# Patient Record
Sex: Female | Born: 1985 | Race: Black or African American | Hispanic: No | Marital: Single | Smoking: Never smoker
Health system: Southern US, Community
[De-identification: ages and names within clinical notes are randomized; demographics above are authoritative.]

## PROBLEM LIST (undated history)

## (undated) DIAGNOSIS — Z789 Other specified health status: Secondary | ICD-10-CM

---

## 2009-09-09 ENCOUNTER — Inpatient Hospital Stay (HOSPITAL_COMMUNITY): Admission: AD | Admit: 2009-09-09 | Discharge: 2009-09-11 | Payer: Self-pay | Admitting: Obstetrics & Gynecology

## 2010-04-07 ENCOUNTER — Ambulatory Visit (HOSPITAL_COMMUNITY)
Admission: RE | Admit: 2010-04-07 | Discharge: 2010-04-07 | Payer: Self-pay | Source: Home / Self Care | Admitting: Obstetrics and Gynecology

## 2010-06-21 ENCOUNTER — Inpatient Hospital Stay (HOSPITAL_COMMUNITY)
Admission: AD | Admit: 2010-06-21 | Discharge: 2010-06-21 | Disposition: A | Discharge status: Home / Self Care | Payer: Private Health Insurance - Indemnity | Source: Ambulatory Visit | Attending: Obstetrics and Gynecology | Admitting: Obstetrics and Gynecology

## 2010-06-21 DIAGNOSIS — O4700 False labor before 37 completed weeks of gestation, unspecified trimester: Secondary | ICD-10-CM | POA: Insufficient documentation

## 2010-06-21 DIAGNOSIS — R109 Unspecified abdominal pain: Secondary | ICD-10-CM | POA: Insufficient documentation

## 2010-06-21 LAB — COMPREHENSIVE METABOLIC PANEL
ALT: 14 U/L (ref 0–35)
AST: 21 U/L (ref 0–37)
Alkaline Phosphatase: 42 U/L (ref 39–117)
CO2: 24 mEq/L (ref 19–32)
Chloride: 102 mEq/L (ref 96–112)
Total Bilirubin: 0.5 mg/dL (ref 0.3–1.2)
Total Protein: 6.1 g/dL (ref 6.0–8.3)

## 2010-06-21 LAB — CBC
HCT: 33.7 % — ABNORMAL LOW (ref 36.0–46.0)
Hemoglobin: 11.2 g/dL — ABNORMAL LOW (ref 12.0–15.0)
RBC: 4.24 MIL/uL (ref 3.87–5.11)
RDW: 14 % (ref 11.5–15.5)
WBC: 9.5 10*3/uL (ref 4.0–10.5)

## 2010-06-21 LAB — FETAL FIBRONECTIN: Fetal Fibronectin: NEGATIVE

## 2010-06-21 LAB — URINALYSIS, ROUTINE W REFLEX MICROSCOPIC
Hgb urine dipstick: NEGATIVE
Ketones, ur: NEGATIVE mg/dL
Protein, ur: NEGATIVE mg/dL
Urine Glucose, Fasting: 100 mg/dL — AB
Urobilinogen, UA: 0.2 mg/dL (ref 0.0–1.0)

## 2010-06-21 LAB — WET PREP, GENITAL

## 2010-06-21 LAB — URINE MICROSCOPIC-ADD ON

## 2010-07-09 ENCOUNTER — Inpatient Hospital Stay (HOSPITAL_COMMUNITY)
Admission: AD | Admit: 2010-07-09 | Discharge: 2010-07-15 | DRG: 782 | Disposition: A | Discharge status: Home / Self Care | Payer: Managed Care, Other (non HMO) | Source: Ambulatory Visit | Attending: Obstetrics and Gynecology | Admitting: Obstetrics and Gynecology

## 2010-07-09 DIAGNOSIS — O4700 False labor before 37 completed weeks of gestation, unspecified trimester: Secondary | ICD-10-CM | POA: Diagnosis present

## 2010-07-09 DIAGNOSIS — O3430 Maternal care for cervical incompetence, unspecified trimester: Principal | ICD-10-CM | POA: Diagnosis present

## 2010-07-09 DIAGNOSIS — O26879 Cervical shortening, unspecified trimester: Secondary | ICD-10-CM | POA: Diagnosis present

## 2010-07-10 ENCOUNTER — Inpatient Hospital Stay (HOSPITAL_COMMUNITY): Payer: Managed Care, Other (non HMO)

## 2010-07-11 ENCOUNTER — Inpatient Hospital Stay (HOSPITAL_COMMUNITY): Payer: Managed Care, Other (non HMO)

## 2010-07-13 ENCOUNTER — Inpatient Hospital Stay (HOSPITAL_COMMUNITY): Payer: Managed Care, Other (non HMO)

## 2010-07-21 LAB — CBC
HCT: 37.4 % (ref 36.0–46.0)
MCHC: 33 g/dL (ref 30.0–36.0)
MCV: 83.5 fL (ref 78.0–100.0)
Platelets: 277 10*3/uL (ref 150–400)
RBC: 4.49 MIL/uL (ref 3.87–5.11)

## 2010-07-28 LAB — STREP B DNA PROBE: Strep Group B Ag: NEGATIVE

## 2010-07-28 LAB — CBC
HCT: 33.5 % — ABNORMAL LOW (ref 36.0–46.0)
MCV: 83.2 fL (ref 78.0–100.0)
WBC: 12 10*3/uL — ABNORMAL HIGH (ref 4.0–10.5)

## 2010-07-28 LAB — DIFFERENTIAL
Basophils Absolute: 0 10*3/uL (ref 0.0–0.1)
Basophils Relative: 0 % (ref 0–1)
Eosinophils Absolute: 0 10*3/uL (ref 0.0–0.7)
Eosinophils Relative: 0 % (ref 0–5)
Neutro Abs: 10.1 10*3/uL — ABNORMAL HIGH (ref 1.7–7.7)
Neutrophils Relative %: 84 % — ABNORMAL HIGH (ref 43–77)

## 2010-09-15 NOTE — Discharge Summary (Signed)
  NAMEJUSTISS, GERBINO            ACCOUNT NO.:  [id number]  MEDICAL RECORD NO.:  [id number]           PATIENT TYPE:  I  LOCATION:  9156                          FACILITY:  [facility]  PHYSICIAN:  Dineen Kid. Rana Snare, M.D.    DATE OF BIRTH:  Jun 09, 1985  DATE OF ADMISSION:  07/09/2010 DATE OF DISCHARGE:  07/15/2010                              DISCHARGE SUMMARY   ADMITTING DIAGNOSES: 1. Intrauterine pregnancy at 87 weeks' estimated gestational age. 2. History of cervical insufficiency and cervical cerclage with     shortening of the cervix and uterine contractions.  DISCHARGE DIAGNOSES: 1. Intrauterine pregnancy at 28-4/7 weeks' estimated gestational age. 2. Cervical insufficiency, tocolyzed.  REASON FOR ADMISSION:  Please see written H and P.  HOSPITAL COURSE:  The patient is a 25 year old, gravida 3, para 0-2-0-0 who was admitted at 50 weeks' estimated gestational age for further cervical shortening and uterine contractions.  The patient's prenatal care has been complicated by history of cervical insufficiency and previously placed cervical cerclage.  The patient was unable to feel the contraction.  She denied any vaginal bleeding or loss of fluid.  Fetal movement was good.  The patient's prenatal history is also complicated by a 24-week cesarean delivery for breech with preterm labor.  Baby had lived approximately 24 hours and also a previous 18-week spontaneous vaginal delivery of twins both of which were deceased.  On admission, cervical length was measured approximately 1.7 cm which was decreased from approximately 1 week ago at which time the cervix was noted to be 2.3 cm.  The patient was then admitted on bedrest.  Betamethasone was given and the patient was placed on magnesium sulfate.  Over the next several days, vital signs remained stable.  Uterus was nontender.  Fetal heart tones were reactive.  Ultrasound did reveal that cervix continued to be shortened.  She remained on  magnesium sulfate.  Fetal heart tones were auscultated with accelerations.  No contractions were observed.  On July 13, 2010, cervix was then measured approximately 1.6 cm.  Magnesium was being tapered.  The patient was restarted on Procardia.  On July 15, 2010, the patient was without complaint.  Fetal heart tones were 140 with accelerations.  Contractions were not observed.  The patient had now been completed betamethasone and was stable on the Procardia. Discharge instructions were reviewed and the patient was later discharged home.  CONDITION ON DISCHARGE:  Stable.  DIET:  Regular as tolerated.  ACTIVITY:  Bedrest with bathroom privileges.  DISCHARGE MEDICATIONS:  Procardia 10 mg every 6 hours and prenatal vitamins 1 p.o. daily.     Julio Sicks, N.P.   ______________________________ Dineen Kid Rana Snare, M.D.    CC/MEDQ  D:  08/10/2010  T:  08/11/2010  Job:  [id number]  Electronically Signed by Julio Sicks N.P. on 08/12/2010 08:54:12 AM Electronically Signed by Candice Camp M.D. on 09/15/2010 09:05:08 AM

## 2010-09-21 ENCOUNTER — Inpatient Hospital Stay (HOSPITAL_COMMUNITY)
Admission: AD | Admit: 2010-09-21 | Discharge: 2010-09-23 | DRG: 775 | Disposition: A | Discharge status: Home / Self Care | Payer: Managed Care, Other (non HMO) | Source: Ambulatory Visit | Attending: Obstetrics and Gynecology | Admitting: Obstetrics and Gynecology

## 2010-09-21 DIAGNOSIS — O34219 Maternal care for unspecified type scar from previous cesarean delivery: Principal | ICD-10-CM | POA: Diagnosis present

## 2010-09-21 LAB — CBC
Hemoglobin: 12.5 g/dL (ref 12.0–15.0)
MCHC: 33.6 g/dL (ref 30.0–36.0)
WBC: 8.7 10*3/uL (ref 4.0–10.5)

## 2010-09-21 LAB — RPR: RPR Ser Ql: NONREACTIVE

## 2010-09-22 LAB — CBC
HCT: 33.2 % — ABNORMAL LOW (ref 36.0–46.0)
MCH: 25.8 pg — ABNORMAL LOW (ref 26.0–34.0)
MCHC: 32.2 g/dL (ref 30.0–36.0)
MCV: 80 fL (ref 78.0–100.0)
RBC: 4.15 MIL/uL (ref 3.87–5.11)
RDW: 14.4 % (ref 11.5–15.5)

## 2012-07-22 IMAGING — US US OB TRANSVAGINAL
1 series · 14 of 21 positions shown · non-contrast
Comparison: none

[Series 1: us ob transvaginal · 14 of 21 slices shown]
[im 1/21]
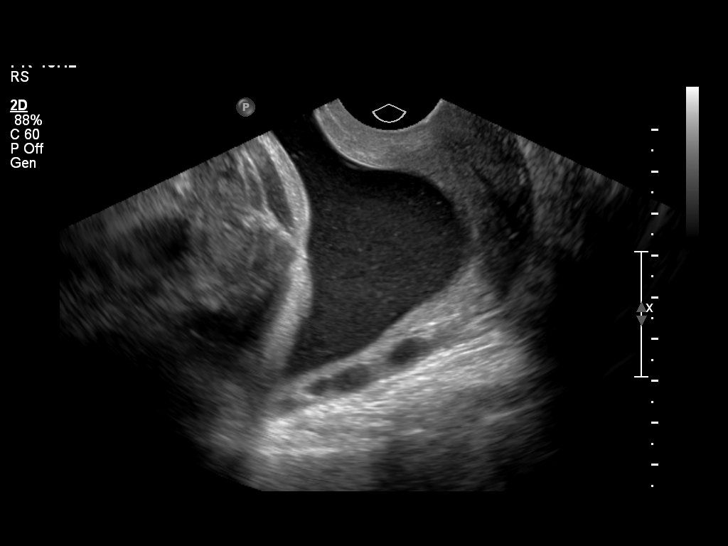
[im 3/21]
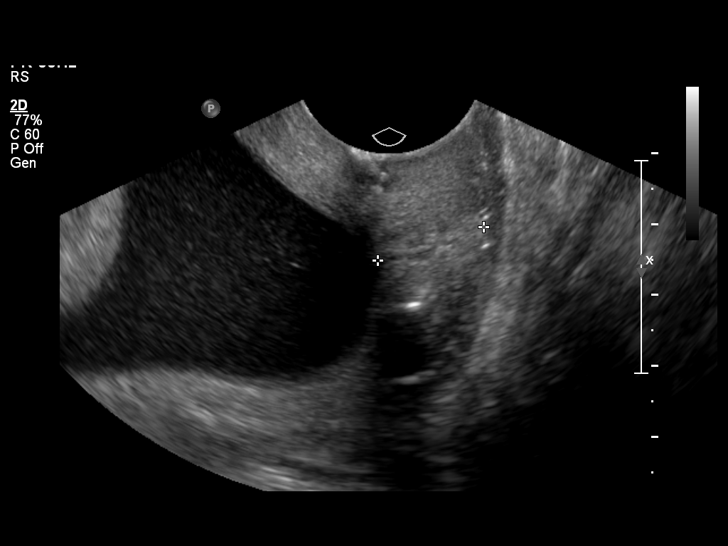
[im 4/21]
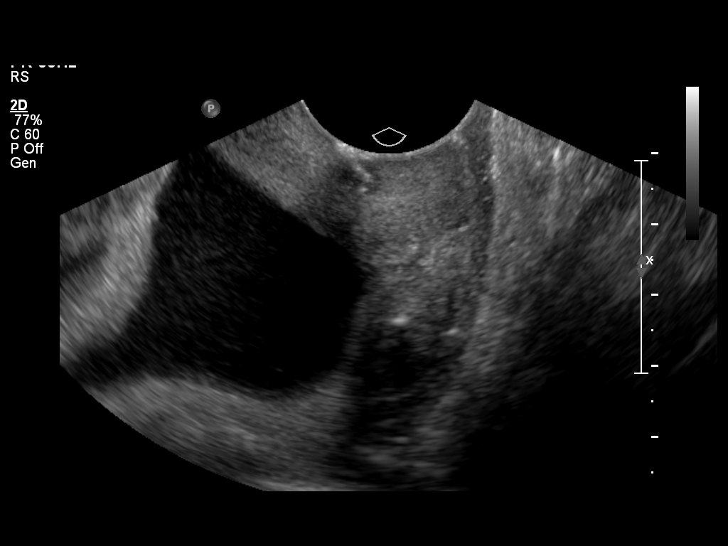
[im 6/21]
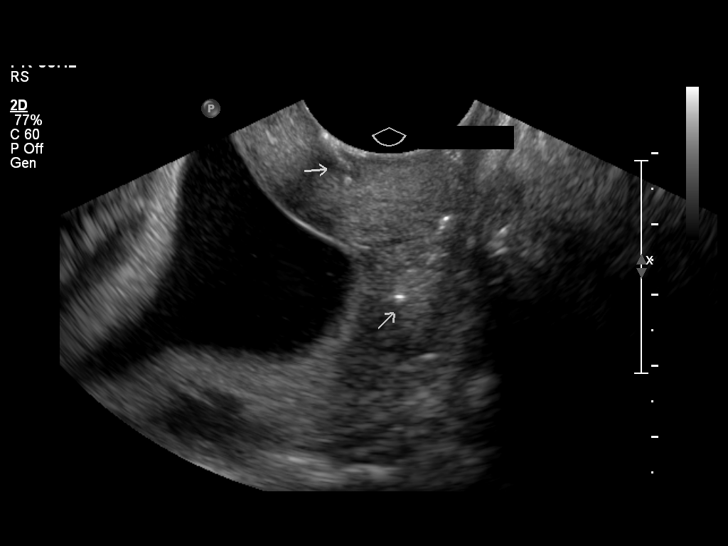
[im 7/21]
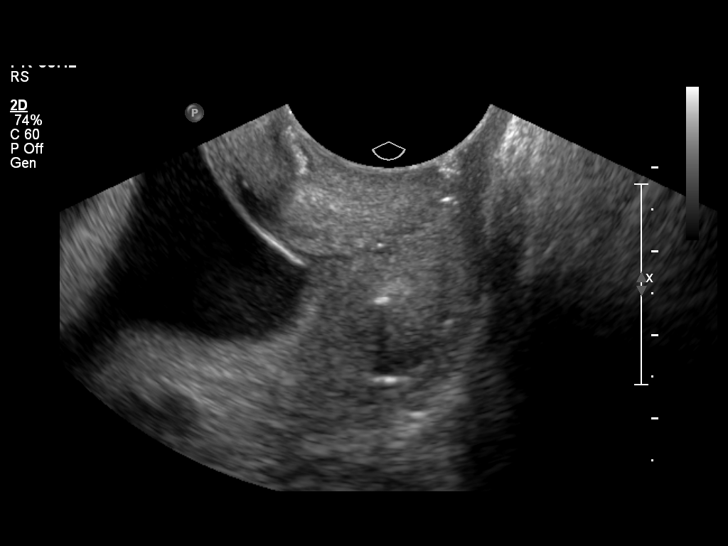
[im 9/21]
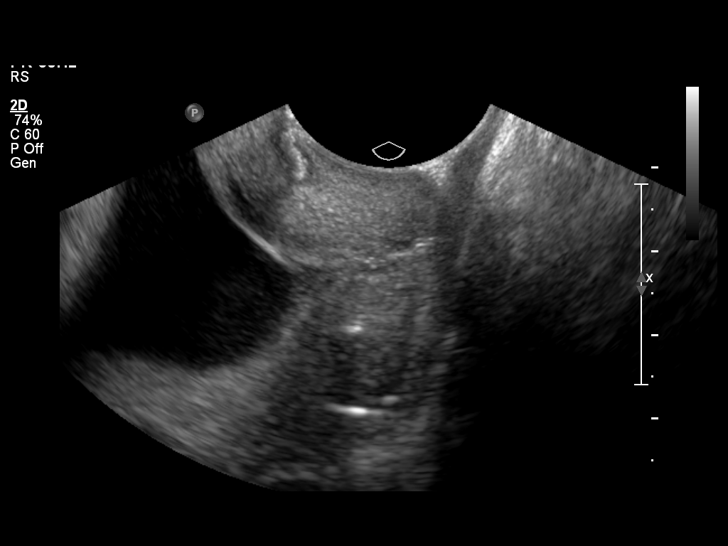
[im 10/21]
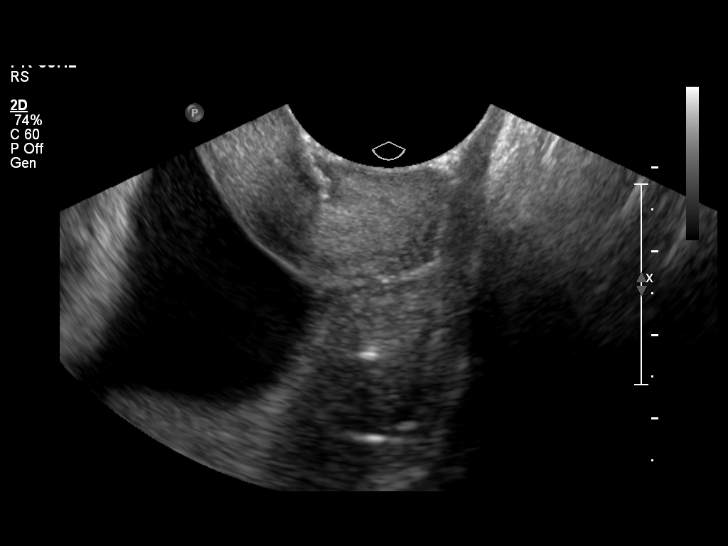
[im 12/21]
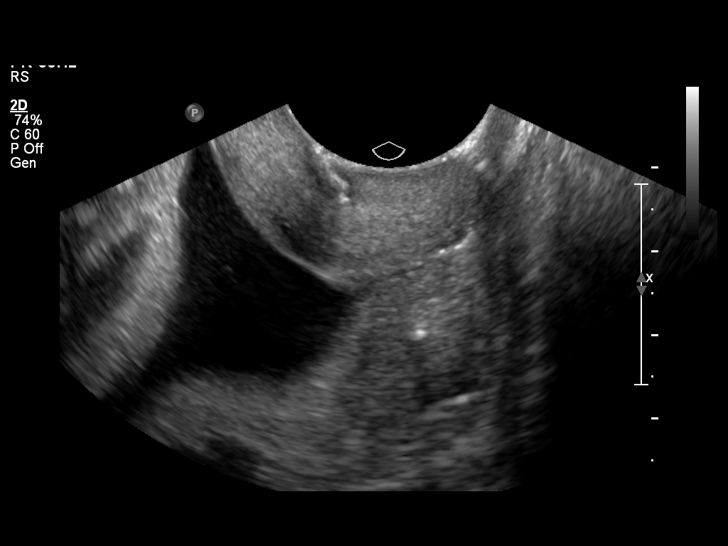
[im 13/21]
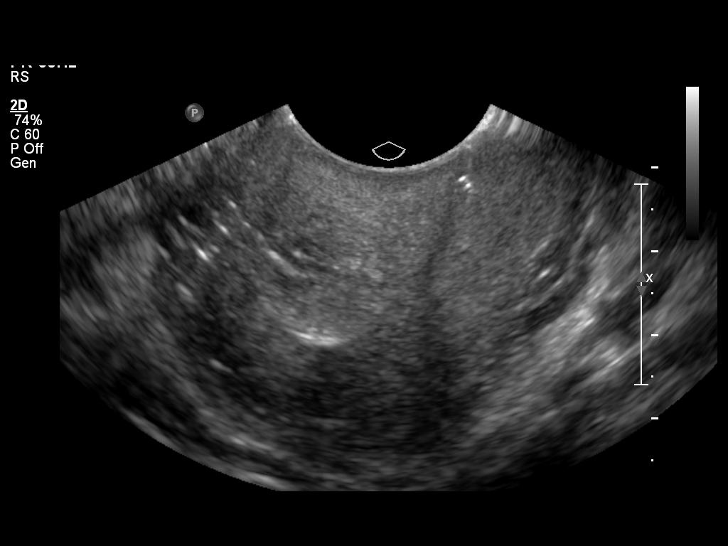
[im 15/21]
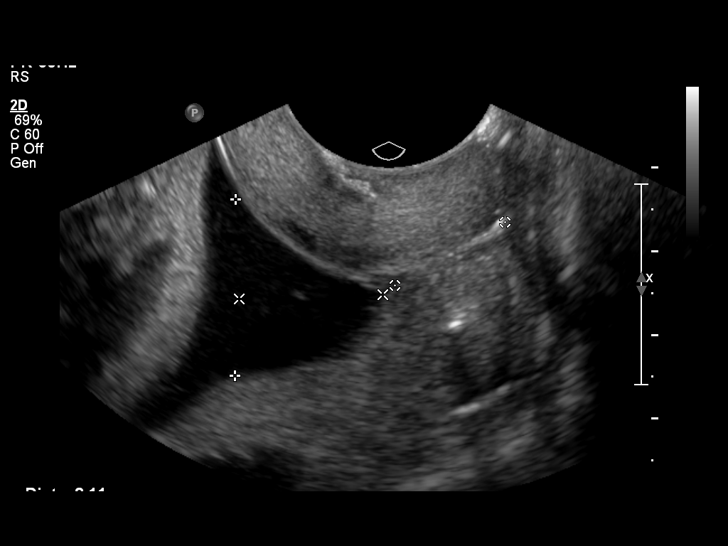
[im 16/21]
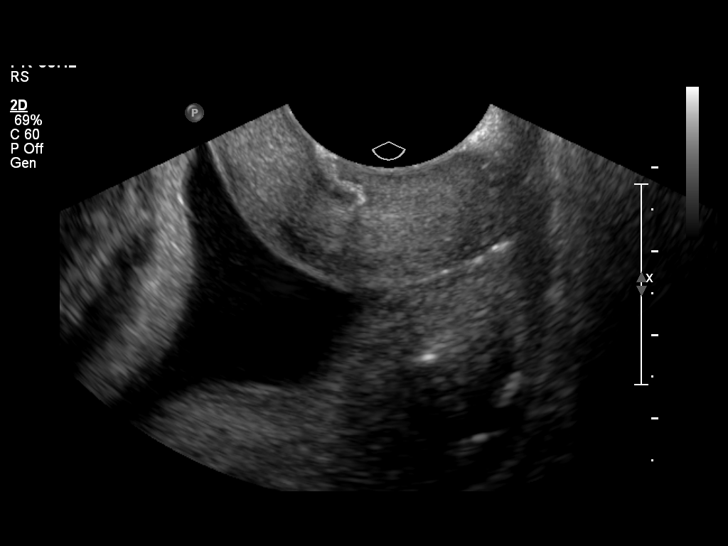
[im 18/21]
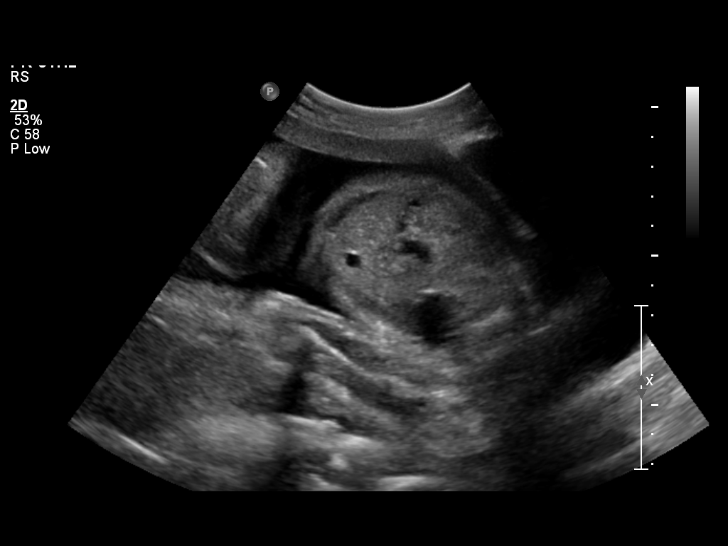
[im 19/21]
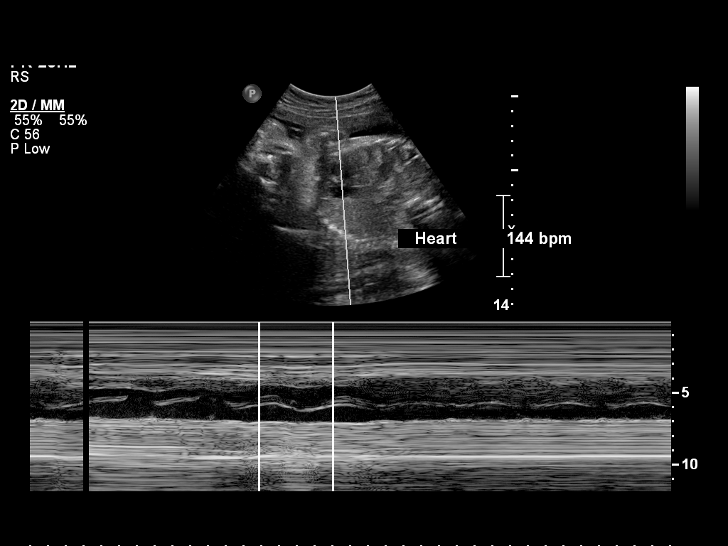
[im 21/21]
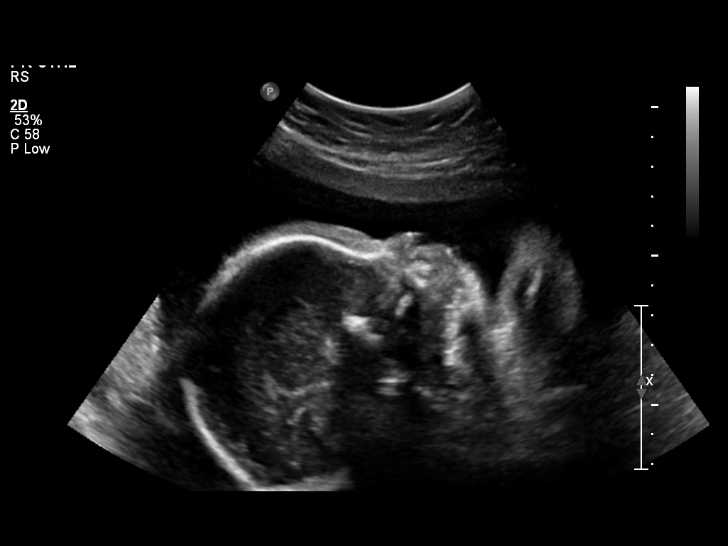

[14 of 21 positions shown; findings below may reference images not displayed]

OBSTETRICS REPORT
                      (Signed Final 07/13/2010 [DATE])

 Order#:         [id number]
Procedures

 US OB TRANSVAGINAL                                    76817.0
Indications

 Cervical insufficiency
 Cerclage
 Assess cervical length
 Poor obstetric history: Previous midtrimester loss
 (add cerv insuff if applicable)
Fetal Evaluation

 Fetal Heart Rate:  144                          bpm
 Cardiac Activity:  Observed
 Presentation:      Frank breech
Gestational Age

 Clinical EDD:  28w 4d                                        EDD:   10/01/10
 Best:          28w 4d     Det. By:  Clinical EDD             EDD:   10/01/10
Cervix Uterus Adnexa

 Cervical Length:    1.6      cm

 Cervix:       Cerclage visualized. Measured transvaginally.
Impression

 Single living intrauterine fetus in Frank breech presentation.
 Stable cervical length measuring 1.6cm, with cerclage
 visualized.

## 2019-04-25 ENCOUNTER — Other Ambulatory Visit (HOSPITAL_COMMUNITY)
Admission: RE | Admit: 2019-04-25 | Discharge: 2019-04-25 | Disposition: A | Discharge status: Home / Self Care | Payer: 59 | Source: Ambulatory Visit | Attending: Obstetrics and Gynecology | Admitting: Obstetrics and Gynecology

## 2019-04-25 ENCOUNTER — Other Ambulatory Visit: Payer: Self-pay

## 2019-04-25 ENCOUNTER — Encounter (HOSPITAL_COMMUNITY): Payer: Self-pay | Admitting: Obstetrics and Gynecology

## 2019-04-25 ENCOUNTER — Ambulatory Visit (HOSPITAL_BASED_OUTPATIENT_CLINIC_OR_DEPARTMENT_OTHER)
Admission: RE | Admit: 2019-04-25 | Discharge: 2019-04-25 | Disposition: A | Discharge status: Home / Self Care | Payer: 59 | Source: Ambulatory Visit | Attending: Obstetrics and Gynecology | Admitting: Obstetrics and Gynecology

## 2019-04-25 ENCOUNTER — Encounter (HOSPITAL_COMMUNITY)
Admission: RE | Disposition: A | Discharge status: Home / Self Care | Payer: Self-pay | Source: Ambulatory Visit | Attending: Obstetrics and Gynecology

## 2019-04-25 ENCOUNTER — Inpatient Hospital Stay (HOSPITAL_COMMUNITY): Payer: 59 | Admitting: Anesthesiology

## 2019-04-25 DIAGNOSIS — O009 Unspecified ectopic pregnancy without intrauterine pregnancy: Secondary | ICD-10-CM | POA: Diagnosis present

## 2019-04-25 DIAGNOSIS — U071 COVID-19: Secondary | ICD-10-CM | POA: Diagnosis not present

## 2019-04-25 DIAGNOSIS — Z3A01 Less than 8 weeks gestation of pregnancy: Secondary | ICD-10-CM | POA: Insufficient documentation

## 2019-04-25 DIAGNOSIS — O00101 Right tubal pregnancy without intrauterine pregnancy: Secondary | ICD-10-CM | POA: Diagnosis not present

## 2019-04-25 DIAGNOSIS — O98511 Other viral diseases complicating pregnancy, first trimester: Secondary | ICD-10-CM | POA: Diagnosis not present

## 2019-04-25 HISTORY — PX: LAPAROSCOPIC UNILATERAL SALPINGECTOMY: SHX5934

## 2019-04-25 HISTORY — DX: Other specified health status: Z78.9

## 2019-04-25 HISTORY — PX: DIAGNOSTIC LAPAROSCOPY WITH REMOVAL OF ECTOPIC PREGNANCY: SHX6449

## 2019-04-25 LAB — POCT I-STAT, CHEM 8
BUN: 6 mg/dL (ref 6–20)
Calcium, Ion: 1.22 mmol/L (ref 1.15–1.40)
Chloride: 101 mmol/L (ref 98–111)
Creatinine, Ser: 0.5 mg/dL (ref 0.44–1.00)
Glucose, Bld: 102 mg/dL — ABNORMAL HIGH (ref 70–99)
HCT: 39 % (ref 36.0–46.0)
Hemoglobin: 13.3 g/dL (ref 12.0–15.0)
Potassium: 3.6 mmol/L (ref 3.5–5.1)
Sodium: 139 mmol/L (ref 135–145)
TCO2: 25 mmol/L (ref 22–32)

## 2019-04-25 LAB — CBC
HCT: 39.8 % (ref 36.0–46.0)
Hemoglobin: 12.5 g/dL (ref 12.0–15.0)
MCH: 25.5 pg — ABNORMAL LOW (ref 26.0–34.0)
MCHC: 31.4 g/dL (ref 30.0–36.0)
MCV: 81.1 fL (ref 80.0–100.0)
Platelets: 370 10*3/uL (ref 150–400)
RBC: 4.91 MIL/uL (ref 3.87–5.11)
RDW: 13.8 % (ref 11.5–15.5)
WBC: 10.4 10*3/uL (ref 4.0–10.5)
nRBC: 0 % (ref 0.0–0.2)

## 2019-04-25 LAB — RESPIRATORY PANEL BY RT PCR (FLU A&B, COVID)
Influenza A by PCR: NEGATIVE
Influenza B by PCR: NEGATIVE
SARS Coronavirus 2 by RT PCR: POSITIVE — AB

## 2019-04-25 LAB — TYPE AND SCREEN
ABO/RH(D): O POS
Antibody Screen: NEGATIVE

## 2019-04-25 LAB — ABO/RH: ABO/RH(D): O POS

## 2019-04-25 SURGERY — LAPAROSCOPY, WITH ECTOPIC PREGNANCY SURGICAL TREATMENT
Anesthesia: General | Site: Abdomen

## 2019-04-25 SURGERY — LAPAROSCOPY, WITH ECTOPIC PREGNANCY SURGICAL TREATMENT
Anesthesia: General | Laterality: Right

## 2019-04-25 MED ORDER — LIDOCAINE 2% (20 MG/ML) 5 ML SYRINGE
INTRAMUSCULAR | Status: AC
  Filled 2019-04-25: qty 5

## 2019-04-25 MED ORDER — SCOPOLAMINE 1 MG/3DAYS TD PT72: 1.0000 | MEDICATED_PATCH | TRANSDERMAL | Status: DC

## 2019-04-25 MED ORDER — OXYCODONE-ACETAMINOPHEN 5-325 MG PO TABS: 1.0000 | ORAL_TABLET | ORAL | 0 refills | Status: AC | PRN

## 2019-04-25 MED ORDER — OXYCODONE HCL 5 MG PO TABS
5.0000 mg | ORAL_TABLET | Freq: Once | ORAL | Status: AC | PRN
  Administered 2019-04-25: 20:00:00 5 mg via ORAL

## 2019-04-25 MED ORDER — SODIUM CHLORIDE 0.9 % IR SOLN
Status: DC | PRN
  Administered 2019-04-25: 1000 mL

## 2019-04-25 MED ORDER — BUPIVACAINE HCL (PF) 0.25 % IJ SOLN
INTRAMUSCULAR | Status: AC
  Filled 2019-04-25: qty 30

## 2019-04-25 MED ORDER — DEXAMETHASONE SODIUM PHOSPHATE 10 MG/ML IJ SOLN
INTRAMUSCULAR | Status: DC | PRN
  Administered 2019-04-25: 5 mg via INTRAVENOUS

## 2019-04-25 MED ORDER — SODIUM CHLORIDE 0.9 % IV SOLN
INTRAVENOUS | Status: AC
  Filled 2019-04-25: qty 2

## 2019-04-25 MED ORDER — PROPOFOL 10 MG/ML IV BOLUS
INTRAVENOUS | Status: DC | PRN
  Administered 2019-04-25: 200 mg via INTRAVENOUS

## 2019-04-25 MED ORDER — SCOPOLAMINE 1 MG/3DAYS TD PT72
MEDICATED_PATCH | TRANSDERMAL | Status: AC
  Administered 2019-04-25: 17:00:00 1.5 mg via TRANSDERMAL
  Filled 2019-04-25: qty 1

## 2019-04-25 MED ORDER — EPHEDRINE 5 MG/ML INJ
INTRAVENOUS | Status: AC
  Filled 2019-04-25: qty 10

## 2019-04-25 MED ORDER — 0.9 % SODIUM CHLORIDE (POUR BTL) OPTIME
TOPICAL | Status: DC | PRN
  Administered 2019-04-25: 18:00:00 1000 mL

## 2019-04-25 MED ORDER — ACETAMINOPHEN 325 MG PO TABS: 1000.0000 mg | ORAL_TABLET | Freq: Once | ORAL | Status: AC

## 2019-04-25 MED ORDER — LABETALOL HCL 5 MG/ML IV SOLN
INTRAVENOUS | Status: AC
  Filled 2019-04-25: qty 4

## 2019-04-25 MED ORDER — OXYCODONE HCL 5 MG/5ML PO SOLN: 5.0000 mg | Freq: Once | ORAL | Status: AC | PRN

## 2019-04-25 MED ORDER — IBUPROFEN 600 MG PO TABS: 600.0000 mg | ORAL_TABLET | Freq: Four times a day (QID) | ORAL | 0 refills | Status: AC | PRN

## 2019-04-25 MED ORDER — ROCURONIUM BROMIDE 10 MG/ML (PF) SYRINGE
PREFILLED_SYRINGE | INTRAVENOUS | Status: DC | PRN
  Administered 2019-04-25: 60 mg via INTRAVENOUS

## 2019-04-25 MED ORDER — ACETAMINOPHEN 325 MG PO TABS
ORAL_TABLET | ORAL | Status: AC
  Administered 2019-04-25: 975 mg via ORAL
  Filled 2019-04-25: qty 3

## 2019-04-25 MED ORDER — MEPERIDINE HCL 25 MG/ML IJ SOLN: 6.2500 mg | INTRAMUSCULAR | Status: DC | PRN

## 2019-04-25 MED ORDER — BUPIVACAINE HCL (PF) 0.25 % IJ SOLN
INTRAMUSCULAR | Status: DC | PRN
  Administered 2019-04-25: 6 mL

## 2019-04-25 MED ORDER — HYDROMORPHONE HCL 1 MG/ML IJ SOLN
0.2500 mg | INTRAMUSCULAR | Status: DC | PRN
  Administered 2019-04-25: 20:00:00 0.5 mg via INTRAVENOUS

## 2019-04-25 MED ORDER — SUCCINYLCHOLINE CHLORIDE 200 MG/10ML IV SOSY
PREFILLED_SYRINGE | INTRAVENOUS | Status: DC | PRN
  Administered 2019-04-25: 140 mg via INTRAVENOUS

## 2019-04-25 MED ORDER — LACTATED RINGERS IV SOLN: INTRAVENOUS | Status: DC

## 2019-04-25 MED ORDER — HYDROMORPHONE HCL 1 MG/ML IJ SOLN
INTRAMUSCULAR | Status: AC
  Filled 2019-04-25: qty 1

## 2019-04-25 MED ORDER — SUGAMMADEX SODIUM 200 MG/2ML IV SOLN
INTRAVENOUS | Status: DC | PRN
  Administered 2019-04-25: 200 mg via INTRAVENOUS

## 2019-04-25 MED ORDER — PHENYLEPHRINE 40 MCG/ML (10ML) SYRINGE FOR IV PUSH (FOR BLOOD PRESSURE SUPPORT)
PREFILLED_SYRINGE | INTRAVENOUS | Status: AC
  Filled 2019-04-25: qty 10

## 2019-04-25 MED ORDER — KETOROLAC TROMETHAMINE 30 MG/ML IJ SOLN: 30.0000 mg | Freq: Once | INTRAMUSCULAR | Status: DC | PRN

## 2019-04-25 MED ORDER — FENTANYL CITRATE (PF) 250 MCG/5ML IJ SOLN
INTRAMUSCULAR | Status: AC
  Filled 2019-04-25: qty 5

## 2019-04-25 MED ORDER — ONDANSETRON HCL 4 MG/2ML IJ SOLN
INTRAMUSCULAR | Status: AC
  Filled 2019-04-25: qty 2

## 2019-04-25 MED ORDER — OXYCODONE HCL 5 MG PO TABS
ORAL_TABLET | ORAL | Status: AC
  Filled 2019-04-25: qty 1

## 2019-04-25 MED ORDER — PROMETHAZINE HCL 25 MG/ML IJ SOLN: 6.2500 mg | INTRAMUSCULAR | Status: DC | PRN

## 2019-04-25 MED ORDER — PROPOFOL 10 MG/ML IV BOLUS
INTRAVENOUS | Status: AC
  Filled 2019-04-25: qty 20

## 2019-04-25 MED ORDER — MIDAZOLAM HCL 2 MG/2ML IJ SOLN
INTRAMUSCULAR | Status: DC | PRN
  Administered 2019-04-25: 2 mg via INTRAVENOUS

## 2019-04-25 MED ORDER — LABETALOL HCL 5 MG/ML IV SOLN
5.0000 mg | Freq: Once | INTRAVENOUS | Status: AC
  Administered 2019-04-25: 20:00:00 5 mg via INTRAVENOUS

## 2019-04-25 MED ORDER — DEXAMETHASONE SODIUM PHOSPHATE 10 MG/ML IJ SOLN
INTRAMUSCULAR | Status: AC
  Filled 2019-04-25: qty 1

## 2019-04-25 MED ORDER — MIDAZOLAM HCL 2 MG/2ML IJ SOLN
INTRAMUSCULAR | Status: AC
  Filled 2019-04-25: qty 2

## 2019-04-25 MED ORDER — FENTANYL CITRATE (PF) 250 MCG/5ML IJ SOLN
INTRAMUSCULAR | Status: DC | PRN
  Administered 2019-04-25: 150 ug via INTRAVENOUS
  Administered 2019-04-25 (×2): 50 ug via INTRAVENOUS

## 2019-04-25 MED ORDER — SODIUM CHLORIDE 0.9 % IV SOLN
2.0000 g | INTRAVENOUS | Status: AC
  Administered 2019-04-25: 2 g via INTRAVENOUS

## 2019-04-25 MED ORDER — SUCCINYLCHOLINE CHLORIDE 200 MG/10ML IV SOSY
PREFILLED_SYRINGE | INTRAVENOUS | Status: AC
  Filled 2019-04-25: qty 10

## 2019-04-25 MED ORDER — ROCURONIUM BROMIDE 10 MG/ML (PF) SYRINGE
PREFILLED_SYRINGE | INTRAVENOUS | Status: AC
  Filled 2019-04-25: qty 10

## 2019-04-25 MED ORDER — LIDOCAINE 2% (20 MG/ML) 5 ML SYRINGE
INTRAMUSCULAR | Status: DC | PRN
  Administered 2019-04-25: 60 mg via INTRAVENOUS

## 2019-04-25 MED ORDER — ONDANSETRON HCL 4 MG/2ML IJ SOLN
INTRAMUSCULAR | Status: DC | PRN
  Administered 2019-04-25: 4 mg via INTRAVENOUS

## 2019-04-25 SURGICAL SUPPLY — 34 items
BARRIER ADHS 3X4 INTERCEED (GAUZE/BANDAGES/DRESSINGS) IMPLANT
CATH ROBINSON RED A/P 16FR (CATHETERS) ×3 IMPLANT
CLOSURE WOUND 1/2 X4 (GAUZE/BANDAGES/DRESSINGS)
COVER WAND RF STERILE (DRAPES) ×3 IMPLANT
DERMABOND ADVANCED (GAUZE/BANDAGES/DRESSINGS) ×2
DERMABOND ADVANCED .7 DNX12 (GAUZE/BANDAGES/DRESSINGS) ×1 IMPLANT
DRSG OPSITE POSTOP 3X4 (GAUZE/BANDAGES/DRESSINGS) ×2 IMPLANT
DURAPREP 26ML APPLICATOR (WOUND CARE) ×4 IMPLANT
GLOVE BIO SURGEON STRL SZ 6.5 (GLOVE) ×2 IMPLANT
GLOVE BIO SURGEONS STRL SZ 6.5 (GLOVE) ×1
GLOVE BIOGEL PI IND STRL 7.0 (GLOVE) ×2 IMPLANT
GLOVE BIOGEL PI INDICATOR 7.0 (GLOVE) ×4
GOWN STRL REUS W/ TWL LRG LVL3 (GOWN DISPOSABLE) ×2 IMPLANT
GOWN STRL REUS W/TWL LRG LVL3 (GOWN DISPOSABLE) ×4
KIT TURNOVER KIT B (KITS) ×3 IMPLANT
NS IRRIG 1000ML POUR BTL (IV SOLUTION) ×3 IMPLANT
PACK LAPAROSCOPY BASIN (CUSTOM PROCEDURE TRAY) ×3 IMPLANT
PACK TRENDGUARD 450 HYBRID PRO (MISCELLANEOUS) IMPLANT
POUCH SPECIMEN RETRIEVAL 10MM (ENDOMECHANICALS) IMPLANT
PROTECTOR NERVE ULNAR (MISCELLANEOUS) ×6 IMPLANT
SEALER TISSUE G2 CVD JAW 45CM (ENDOMECHANICALS) ×2 IMPLANT
SET IRRIG TUBING LAPAROSCOPIC (IRRIGATION / IRRIGATOR) ×2 IMPLANT
SET TUBE SMOKE EVAC HIGH FLOW (TUBING) ×3 IMPLANT
SLEEVE ENDOPATH XCEL 5M (ENDOMECHANICALS) ×3 IMPLANT
STRIP CLOSURE SKIN 1/2X4 (GAUZE/BANDAGES/DRESSINGS) IMPLANT
SUT VIC AB 3-0 PS2 18 (SUTURE) ×2
SUT VIC AB 3-0 PS2 18XBRD (SUTURE) ×1 IMPLANT
SUT VICRYL 0 UR6 27IN ABS (SUTURE) ×3 IMPLANT
TOWEL GREEN STERILE FF (TOWEL DISPOSABLE) ×6 IMPLANT
TRAY URETHRAL FOLEY CATH 14FR (CATHETERS) ×2 IMPLANT
TRENDGUARD 450 HYBRID PRO PACK (MISCELLANEOUS) ×3
TROCAR OPTI TIP 5M 100M (ENDOMECHANICALS) ×3 IMPLANT
TROCAR XCEL NON-BLD 11X100MML (ENDOMECHANICALS) ×3 IMPLANT
WARMER LAPAROSCOPE (MISCELLANEOUS) ×3 IMPLANT

## 2019-04-25 NOTE — Progress Notes (Signed)
Wasted 0.5 mg dilaudid in OR.  Witness Nash Mantis Rn

## 2019-04-25 NOTE — Anesthesia Procedure Notes (Signed)
Procedure Name: Intubation Date/Time: 04/25/2019 6:09 PM Performed by: Janace Litten, CRNA Pre-anesthesia Checklist: Patient identified, Emergency Drugs available, Suction available and Patient being monitored Patient Re-evaluated:Patient Re-evaluated prior to induction Oxygen Delivery Method: Circle System Utilized Preoxygenation: Pre-oxygenation with 100% oxygen Induction Type: IV induction, Rapid sequence and Cricoid Pressure applied Laryngoscope Size: Mac and 3 Grade View: Grade I Tube type: Oral Tube size: 7.0 mm Number of attempts: 1 Airway Equipment and Method: Stylet Placement Confirmation: ETT inserted through vocal cords under direct vision and positive ETCO2 Secured at: 22 cm Tube secured with: Tape Dental Injury: Teeth and Oropharynx as per pre-operative assessment

## 2019-04-25 NOTE — Anesthesia Postprocedure Evaluation (Signed)
Anesthesia Post Note  Patient: Alexandria Sullivan  Procedure(s) Performed: DIAGNOSTIC LAPAROSCOPY, (N/A Abdomen) Laparoscopic  Right Salpingectomy and removal of Ectopic Pregnancy (N/A Abdomen)     Anesthesia Post Evaluation  Last Vitals:  Vitals:   04/25/19 1955 04/25/19 2005  BP: 132/76 124/85  Pulse: (!) 117 (!) 107  Resp: 16 16  Temp:  37.3 C  SpO2: 100% 98%    Last Pain:  Vitals:   04/25/19 2005  PainSc: 3                  Persia Lintner DAVID

## 2019-04-25 NOTE — Anesthesia Postprocedure Evaluation (Signed)
Anesthesia Post Note  Patient: Alexandria Sullivan  Procedure(s) Performed: DIAGNOSTIC LAPAROSCOPY, (N/A Abdomen) Laparoscopic  Right Salpingectomy and removal of Ectopic Pregnancy (N/A Abdomen)     Patient location during evaluation: PACU Anesthesia Type: General Level of consciousness: awake and alert Pain management: pain level controlled Vital Signs Assessment: post-procedure vital signs reviewed and stable Respiratory status: spontaneous breathing, nonlabored ventilation, respiratory function stable and patient connected to nasal cannula oxygen Cardiovascular status: blood pressure returned to baseline and stable Postop Assessment: no apparent nausea or vomiting Anesthetic complications: no    Last Vitals:  Vitals:   04/25/19 1955 04/25/19 2005  BP: 132/76 124/85  Pulse: (!) 117 (!) 107  Resp: 16 16  Temp:  37.3 C  SpO2: 100% 98%    Last Pain:  Vitals:   04/25/19 2005  PainSc: 3                  Delila Kuklinski DAVID

## 2019-04-25 NOTE — H&P (Signed)
Alexandria Sullivan is an 33 y.o. female presents for surgical mngt of right ectopic pregnancy.  No pelvic pain.  Light spotting.      Menstrual History: LMP:  03/01/19    PMHx:  negative  PSHx:  c section  Social History: No tobacco, etoh or IVDU  Allergies: none  Meds:  none  Review of Systems mild cramping.  Light spotting  AF, VSS Physical Exam  Gen - NAD Abd - soft, NT PV - deferred  PVUS:  Right ectopic pregnancy - [redacted]w[redacted]d with + FHT  Blood type O+  Assessment/Plan:  Right ectopic pregnancy Laparoscopic right salpingectomy R/b/a discussed including risk of bleeding, blood transfusion, infection, damage to surrounding organs.  Questions answered and informed consent  Marylynn Pearson 04/25/2019, 11:44 AM

## 2019-04-25 NOTE — Anesthesia Preprocedure Evaluation (Addendum)
Anesthesia Evaluation  Patient identified by MRN, date of birth, ID band Patient awake    Reviewed: Allergy & Precautions, NPO status , Patient's Chart, lab work & pertinent test results  Airway Mallampati: II  TM Distance: >3 FB Neck ROM: Full    Dental no notable dental hx. (+) Dental Advisory Given   Pulmonary neg pulmonary ROS,    Pulmonary exam normal breath sounds clear to auscultation       Cardiovascular negative cardio ROS Normal cardiovascular exam Rhythm:Regular Rate:Normal     Neuro/Psych negative neurological ROS  negative psych ROS   GI/Hepatic negative GI ROS, Neg liver ROS,   Endo/Other  negative endocrine ROS  Renal/GU negative Renal ROS  negative genitourinary   Musculoskeletal negative musculoskeletal ROS (+)   Abdominal   Peds negative pediatric ROS (+)  Hematology negative hematology ROS (+)   Anesthesia Other Findings   Reproductive/Obstetrics Live ectopic pregnancy                            Anesthesia Physical Anesthesia Plan  ASA: II and emergent  Anesthesia Plan: General   Post-op Pain Management:    Induction: Intravenous  PONV Risk Score and Plan: 4 or greater and Ondansetron, Dexamethasone, Scopolamine patch - Pre-op, Midazolam and Treatment may vary due to age or medical condition  Airway Management Planned: Oral ETT  Additional Equipment: None  Intra-op Plan:   Post-operative Plan: Extubation in OR  Informed Consent: I have reviewed the patients History and Physical, chart, labs and discussed the procedure including the risks, benefits and alternatives for the proposed anesthesia with the patient or authorized representative who has indicated his/her understanding and acceptance.     Dental advisory given  Plan Discussed with: CRNA  Anesthesia Plan Comments:         Anesthesia Quick Evaluation

## 2019-04-25 NOTE — Transfer of Care (Signed)
Immediate Anesthesia Transfer of Care Note  Patient: Alexandria Sullivan  Procedure(s) Performed: DIAGNOSTIC LAPAROSCOPY, (N/A Abdomen) Laparoscopic  Right Salpingectomy and removal of Ectopic Pregnancy (N/A Abdomen)  Patient Location: PACU and OR #9  Anesthesia Type:General  Level of Consciousness: awake, alert  and patient cooperative  Airway & Oxygen Therapy: Patient Spontanous Breathing and Patient connected to face mask  Post-op Assessment: Report given to RN, Post -op Vital signs reviewed and stable and Patient moving all extremities X 4  Post vital signs: Reviewed and stable  Last Vitals:  Vitals Value Taken Time  BP    Temp    Pulse    Resp    SpO2      Last Pain:  Vitals:   04/25/19 1641  PainSc: 0-No pain      Patients Stated Pain Goal: 0 (19/50/93 2671)  Complications: No apparent anesthesia complications

## 2019-04-25 NOTE — Op Note (Signed)
Diagnostic Laparoscopy Procedure Note  Indications: The patient is a 33 y.o. female with right ectopic pregnancy.  Pre-operative Diagnosis: 1. ectopic pregnancy, right  2. COVID 19+  Post-operative Diagnosis: same  Surgeon: Marylynn Pearson   Anesthesia: General endotracheal anesthesia  Procedure Details  The patient was seen in the Holding Room. The risks, benefits, complications, treatment options, and expected outcomes were discussed with the patient. The possibilities of reaction to medication, pulmonary aspiration, perforation of viscus, bleeding, recurrent infection, the need for additional procedures, failure to diagnose a condition, and creating a complication requiring transfusion or operation were discussed with the patient. The patient concurred with the proposed plan, giving informed consent. The patient was taken to the Operating Room, identified as Mal Misty and the procedure verified as Diagnostic Laparoscopy. A Time Out was held and the above information confirmed.  After induction of general anesthesia, the patient was placed in modified dorsal lithotomy position where she was prepped, draped, and catheterized in the normal, sterile fashion.  The cervix was visualized and an intrauterine manipulator was placed. A 2 cm umbilical incision was then performed. Optical trocar was inserted under direct visualization.  Right ectopic pregnancy noted with small amount of surrounding blood.  5 mm suprapubic trocar inserted under direct visualization.  Atraumatic grasper used to elevate the right fallopian tube.  Enseal was used to grasp, cauterize and cut along the mesosalpinx staying just adjacent to the tube and ectopic.  The right fallopian tube and ectopic were then placed in a specimen bag and removed from the abdomen through the umbilical incision.    Following the procedure the umbilical sheath was removed after intra-abdominal carbon dioxide was expressed. The incision was  closed with subcutaneous and subcuticular sutures of 4-0 Vicryl. The intrauterine manipulator was then removed.  Instrument, sponge, and needle counts were correct prior to abdominal closure and at the conclusion of the case.    Estimated Blood Loss:  less than 100 mL         Drains: none                Specimens: Right fallopian tube and ectopic pregnancy             Complications:  None; patient tolerated the procedure well.               Condition: stable

## 2019-04-26 ENCOUNTER — Encounter: Payer: Self-pay | Admitting: *Deleted

## 2019-04-27 LAB — SURGICAL PATHOLOGY
